# Patient Record
Sex: Male | Born: 2000 | Race: Black or African American | Hispanic: No | Marital: Single | Smoking: Never smoker
Health system: Southern US, Community
[De-identification: ages and names within clinical notes are randomized; demographics above are authoritative.]

## PROBLEM LIST (undated history)

## (undated) DIAGNOSIS — T7840XA Allergy, unspecified, initial encounter: Secondary | ICD-10-CM

## (undated) DIAGNOSIS — E669 Obesity, unspecified: Secondary | ICD-10-CM

## (undated) DIAGNOSIS — H539 Unspecified visual disturbance: Secondary | ICD-10-CM

## (undated) HISTORY — DX: Allergy, unspecified, initial encounter: T78.40XA

## (undated) HISTORY — DX: Obesity, unspecified: E66.9

## (undated) HISTORY — DX: Unspecified visual disturbance: H53.9

---
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
## 2023-09-06 DIAGNOSIS — Z131 Encounter for screening for diabetes mellitus: Secondary | ICD-10-CM | POA: Diagnosis not present

## 2023-09-06 DIAGNOSIS — Z0000 Encounter for general adult medical examination without abnormal findings: Secondary | ICD-10-CM | POA: Diagnosis not present

## 2023-09-06 DIAGNOSIS — Z9189 Other specified personal risk factors, not elsewhere classified: Secondary | ICD-10-CM | POA: Diagnosis not present

## 2023-09-06 DIAGNOSIS — Z13220 Encounter for screening for lipoid disorders: Secondary | ICD-10-CM | POA: Diagnosis not present

## 2023-09-06 DIAGNOSIS — Z113 Encounter for screening for infections with a predominantly sexual mode of transmission: Secondary | ICD-10-CM | POA: Diagnosis not present

## 2023-11-15 DIAGNOSIS — Z9189 Other specified personal risk factors, not elsewhere classified: Secondary | ICD-10-CM | POA: Diagnosis not present

## 2023-11-15 DIAGNOSIS — E786 Lipoprotein deficiency: Secondary | ICD-10-CM | POA: Diagnosis not present

## 2023-12-01 DIAGNOSIS — H5213 Myopia, bilateral: Secondary | ICD-10-CM | POA: Diagnosis not present

## 2023-12-07 ENCOUNTER — Encounter: Payer: Self-pay | Admitting: Pulmonary Disease

## 2024-01-12 ENCOUNTER — Ambulatory Visit (INDEPENDENT_AMBULATORY_CARE_PROVIDER_SITE_OTHER)

## 2024-01-12 VITALS — BP 122/72 | HR 80 | Ht 69.0 in | Wt 325.6 lb

## 2024-01-12 DIAGNOSIS — R0683 Snoring: Secondary | ICD-10-CM

## 2024-01-12 DIAGNOSIS — Z6841 Body Mass Index (BMI) 40.0 and over, adult: Secondary | ICD-10-CM

## 2024-01-12 DIAGNOSIS — J301 Allergic rhinitis due to pollen: Secondary | ICD-10-CM | POA: Diagnosis not present

## 2024-01-12 NOTE — Progress Notes (Signed)
 New Patient Pulmonology Office Visit   Subjective:  Patient ID: Daniel Pratt, male    DOB: Feb 12, 2001  MRN: [id number]  Referred by: Dozier Nat CROME, MD  CC:  Chief Complaint  Patient presents with   Consult    Back in May pt states he was having anxiety and would wake up and gasp like I wasn't breathing for two minutes Pt states this is no longer an issue. Used to snore but no longer does, no trouble falling asleep or staying asleep and no excessive tiredness.  No Flu shot this year,     HPI Daniel Pratt is a 23 y.o. male with Allergic rhinitis, anxiety, morbid obesity presents for evaluation of OSA.   More active now and lost 20-25 lbs since last 6 months.  No bed partner.   Symptoms: Before weight loss was having snoring, gasping arousal, apnea. Before, also had anxiety to sleep. Now it has resolved.  Stuffy nose during waking up. Does not take flonase . Flonase  helps.   Mouth breather: no Preferred sleeping position: side.   Sleep related Symptoms:  Snoring- no Witnessed apnea- no Gasping/choking- no morning HA/dry mouth- no/no tired on awakening, excessive daytime sleepiness- no Restless legs- no Does have some intermittent nightmares. Does not bother him. No other parasomnia.   Sleep routine:  -Bed: 11p, falls asleep in 5 min.  -Nocturnal awakenings: once to use bathroom.  -Wake: 6.30 a -Napping: no -sleep hygiene: usually on phone and then pray for sometime and then go to sleep.   Social Hist/Habits:  -Caffeine: no -Alcohol: no -Nicotine:no. Used to smoke marijuana socially.  -Occupation: in college accounting major.    PRIOR TESTS and IMAGING: PSG/HSAT: none.       01/12/2024    8:00 AM  Results of the Epworth flowsheet  Sitting and reading 1  Watching TV 1  Sitting, inactive in a public place (e.g. a theatre or a meeting) 0  As a passenger in a car for an hour without a break 0  Lying down to rest in the afternoon when circumstances permit  1  Sitting and talking to someone 0  Sitting quietly after a lunch without alcohol 0  In a car, while stopped for a few minutes in traffic 0  Total score 3    Allergies: Patient has no known allergies.  Current Outpatient Medications:    clindamycin -benzoyl peroxide (BENZACLIN) gel, Apply topically 2 (two) times daily. (Patient not taking: Reported on 01/12/2024), Disp: 25 g, Rfl: 11   fluticasone  (FLONASE ) 50 MCG/ACT nasal spray, Place 2 sprays into both nostrils daily. (Patient not taking: Reported on 01/12/2024), Disp: 16 g, Rfl: 12 Past Medical History:  Diagnosis Date   Allergy    Obesity    Vision abnormalities    No past surgical history on file. No family history on file. Social History   Socioeconomic History   Marital status: Single    Spouse name: Not on file   Number of children: Not on file   Years of education: Not on file   Highest education level: Not on file  Occupational History   Not on file  Tobacco Use   Smoking status: Never   Smokeless tobacco: Never   Tobacco comments:    Smokes weed occasionally for 2-3 years, only on weekends or events. Pt does not smoke anymore  Substance and Sexual Activity   Alcohol use: No   Drug use: No   Sexual activity: Never  Other Topics Concern  Not on file  Social History Narrative   Lives with parents and 2 younger brothers.     Entering the 5th grade.  Plays football.   Doing well in school.  Enjoys reading.   Social Drivers of Corporate investment banker Strain: Not on file  Food Insecurity: Not on file  Transportation Needs: Not on file  Physical Activity: Not on file  Stress: Not on file  Social Connections: Not on file  Intimate Partner Violence: Not on file       Objective:  BP 122/72   Pulse 80   Ht 5' 9 (1.753 m)   Wt (!) 325 lb 9.6 oz (147.7 kg)   SpO2 99% Comment: RA  BMI 48.08 kg/m  BMI Readings from Last 3 Encounters:  01/12/24 48.08 kg/m  11/04/20 51.06 kg/m (>99%, Z= 3.86, 176%  of 95%ile)*  03/28/19 47.56 kg/m (>99%, Z= 3.69, 171% of 95%ile)*   * Growth percentiles are based on CDC (Boys, 2-20 Years) data.    Physical Exam: CONSTITUTIONAL: NAD, well-appearing NASAL/OROPHARYNX:  Normal mucosa. No septal deviation. No hypertrophy of inferior turbinates. Modified Mallampati score 4. Tonsillar grade 1 CV: RRR s1s2 nl, no murmurs  RESP: Clear to auscultation, normal respiratory effort   NEURO: CN II/XII grossly intact PSYCH: Alert & oriented x 3, Euthymic, appropriate affect  Diagnostic Review:  Last metabolic panel Lab Results  Component Value Date   GLUCOSE 88 11/04/2020   NA 137 11/04/2020   K 4.5 11/04/2020   CL 102 11/04/2020   CO2 28 11/04/2020   BUN 12 11/04/2020   CREATININE 0.94 11/04/2020   CALCIUM 9.8 11/04/2020   PROT 7.9 11/04/2020   ALBUMIN 4.5 03/01/2015   BILITOT 0.8 11/04/2020   ALKPHOS 248 03/01/2015   AST 26 11/04/2020   ALT 27 11/04/2020         Assessment & Plan:   Assessment & Plan Snoring Currently does not have any features of OSA. Snoring has resolved as well and he does not feel tired. Will defer OSA evaluation for now. Did offer to return if he has symptoms of OSA as he is at high risk with his weight.   I discussed with the patient the pathophysiology of obstructive sleep apnea, its association with weight, and its negative effects on hypertension, diabetes, mental health, A-fib, stroke if left untreated.  I briefly discussed the treatment options for obstructive sleep apnea.    Morbid obesity (HCC) Has modified lifestyle and losing weight. Wants to continue to lose weight with self help.     Allergic rhinitis due to pollen, unspecified seasonality Cont flonase  when has nasal congestion. Saline spray as needed.       He was counselled about not driving while drowsy which is common side effect of sleep related disorders.   No follow-ups on file.   Jeanice Dempsey, MD

## 2024-01-12 NOTE — Assessment & Plan Note (Signed)
 Cont flonase  when has nasal congestion. Saline spray as needed.

## 2024-01-12 NOTE — Patient Instructions (Signed)
 Notification of test results are managed in the following manner: If there are any recommendations or changes to the plan of care discussed in office today, we will contact you and let you know what they are. If you do not hear from us , then your results are normal/expected and you can view them through your MyChart account, or a letter will be sent to you. Thank you again for trusting us  with your care [facility].

## 2024-03-20 ENCOUNTER — Emergency Department (HOSPITAL_COMMUNITY)
Admission: EM | Admit: 2024-03-20 | Discharge: 2024-03-21 | Disposition: A | Attending: Emergency Medicine | Admitting: Emergency Medicine

## 2024-03-20 DIAGNOSIS — R112 Nausea with vomiting, unspecified: Secondary | ICD-10-CM | POA: Diagnosis not present

## 2024-03-20 DIAGNOSIS — J029 Acute pharyngitis, unspecified: Secondary | ICD-10-CM | POA: Diagnosis present

## 2024-03-20 NOTE — ED Triage Notes (Signed)
 Patient here with complaints of sore throat since 6pm today. He states he is drooling and he can't swallow. He reports he is having shortness of breath. Denies fever, nasal congestion, and headache.

## 2024-03-21 ENCOUNTER — Other Ambulatory Visit: Payer: Self-pay

## 2024-03-21 ENCOUNTER — Encounter (HOSPITAL_COMMUNITY): Payer: Self-pay

## 2024-03-21 LAB — RESP PANEL BY RT-PCR (RSV, FLU A&B, COVID)  RVPGX2
Influenza A by PCR: NEGATIVE
Influenza B by PCR: NEGATIVE
Resp Syncytial Virus by PCR: NEGATIVE
SARS Coronavirus 2 by RT PCR: NEGATIVE

## 2024-03-21 LAB — GROUP A STREP BY PCR: Group A Strep by PCR: NOT DETECTED

## 2024-03-21 MED ORDER — ACETAMINOPHEN 325 MG PO TABS
650.0000 mg | ORAL_TABLET | ORAL | Status: AC
  Administered 2024-03-21: 650 mg via ORAL
  Filled 2024-03-21: qty 2

## 2024-03-21 NOTE — ED Provider Notes (Signed)
 [place] EMERGENCY DEPARTMENT AT [facility] Provider Note   CSN: [id number] Arrival date & time: 03/20/24  2229     Patient presents with: Sore Throat and Dysphagia   Daniel Pratt is a 23 y.o. male with history of elevated blood pressure, obesity.  Patient presents to ED complaining of sore throat.  States that this morning he had been throwing up.  States that last night he had food poisoning, believes it was due to eating front.  States that after having 2 episodes of nausea and vomiting he went to bed and woke up around 4 PM with a sore throat.  This concerned him and he came to the ED.  He reports that his sore throat has actually gotten better while waiting in the waiting room.  He denies any nausea, vomiting, fevers at home.  Denies any known sick contacts.  Denies any abdominal pain or diarrhea.  Reports all the symptoms have largely resolved.   Sore Throat       Prior to Admission medications   Medication Sig Start Date End Date Taking? Authorizing Provider  clindamycin -benzoyl peroxide (BENZACLIN) gel Apply topically 2 (two) times daily. Patient not taking: Reported on 01/12/2024 03/28/19   Linard Deland BRAVO, MD  fluticasone  (FLONASE ) 50 MCG/ACT nasal spray Place 2 sprays into both nostrils daily. Patient not taking: Reported on 01/12/2024 03/28/19   Linard Deland BRAVO, MD    Allergies: Patient has no known allergies.    Review of Systems  HENT:  Positive for sore throat.   All other systems reviewed and are negative.   Updated Vital Signs BP 132/66   Pulse 80   Temp 99 F (37.2 C)   Resp 17   Ht 5' 8 (1.727 m)   Wt (!) 149.7 kg   SpO2 99%   BMI 50.18 kg/m   Physical Exam Vitals and nursing note reviewed.  Constitutional:      General: He is not in acute distress.    Appearance: He is well-developed.  HENT:     Head: Normocephalic and atraumatic.     Mouth/Throat:     Pharynx: No oropharyngeal exudate or posterior oropharyngeal  erythema.     Comments: Uvula midline, handling secretions appropriately, no drooling, no change in phonation.  No RPA, PTA.  No Ludwig angina. Eyes:     Conjunctiva/sclera: Conjunctivae normal.  Cardiovascular:     Rate and Rhythm: Normal rate and regular rhythm.     Heart sounds: No murmur heard. Pulmonary:     Effort: Pulmonary effort is normal. No respiratory distress.     Breath sounds: Normal breath sounds.  Abdominal:     Palpations: Abdomen is soft.     Tenderness: There is no abdominal tenderness.  Musculoskeletal:        General: No swelling.     Cervical back: Neck supple.  Skin:    General: Skin is warm and dry.     Capillary Refill: Capillary refill takes less than 2 seconds.  Neurological:     Mental Status: He is alert.  Psychiatric:        Mood and Affect: Mood normal.     (all labs ordered are listed, but only abnormal results are displayed) Labs Reviewed  GROUP A STREP BY PCR  RESP PANEL BY RT-PCR (RSV, FLU A&B, COVID)  [id number]    EKG: None  Radiology: No results found.  Procedures   Medications Ordered in the ED  acetaminophen (TYLENOL) tablet 650 mg (650  mg Oral Given 03/21/24 0257)    Medical Decision Making Risk OTC drugs.   23 year old male presents for evaluation of sore throat.  Patient reports that he had 2 episodes of nausea and vomiting this morning, went to bed and woke up with a sore throat.  On arrival to the ED the patient is tachycardic however this normalized without any kind of intervention.  He is afebrile.  His lung sounds are clear bilaterally, no hypoxia.  Abdomen is soft and compressible.  No tenderness noted.  Neuroexam at baseline.  Overall the patient is nontoxic in appearance.  Patient was offered lab work, CT scan of soft tissue neck, further workup.  The patient reports that most of his symptoms have resolved and he defers on the above-stated interventions and evaluations.  He reports that he feels back to his baseline.   He is alert and oriented x 4.  Able to make medical decisions for himself.  I feel this is reasonable.  The patient states that he wishes to go home and observe himself.  Patient believes that his sore throat was most likely due to him throwing up today.  He was given Tylenol in the department, passed p.o. fluid challenge without difficulty.  He was advised to follow-up with his PCP.  He was advised to return to the ED with return of symptoms or worsening symptoms and he voiced understanding.    Final diagnoses:  Sore throat    ED Discharge Orders     None          [name] 03/21/24 9650    Jerral Meth, MD 03/21/24 [phone]

## 2024-03-21 NOTE — Discharge Instructions (Signed)
 As we discussed, please return to the ED with new symptoms such as fevers, increased soreness to your throat.  Please take Tylenol or ibuprofen at home for pain.  Follow-up with your PCP for further care.  Return to the ED with any new symptoms.

## 2024-03-21 NOTE — ED Triage Notes (Signed)
 Pt arrived from home via POV c/o sore throat that began at 1700 today 10/10 on pain scale. Pt is drinking water here in triage and is swallowing well. Pt states that it hurst to swallow and that he feels sob sometimes.
